# Patient Record
Sex: Female | Born: 2005 | Hispanic: No | Marital: Single | Smoking: Never smoker
Health system: Southern US, Community
[De-identification: ages and names within clinical notes are randomized; demographics above are authoritative.]

---
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
## 2025-03-03 ENCOUNTER — Encounter: Payer: Self-pay | Admitting: Nurse Practitioner
